# Patient Record
Sex: Female | Born: 1995 | Race: Black or African American | Hispanic: No | Marital: Single | Smoking: Never smoker
Health system: Southern US, Community
[De-identification: ages and names within clinical notes are randomized; demographics above are authoritative.]

---
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
## 2017-01-14 ENCOUNTER — Ambulatory Visit (HOSPITAL_COMMUNITY)
Admission: EM | Admit: 2017-01-14 | Discharge: 2017-01-14 | Disposition: A | Discharge status: Home / Self Care | Payer: No Typology Code available for payment source | Attending: Family Medicine | Admitting: Family Medicine

## 2017-01-14 ENCOUNTER — Encounter (HOSPITAL_COMMUNITY): Payer: Self-pay | Admitting: Family Medicine

## 2017-01-14 DIAGNOSIS — J209 Acute bronchitis, unspecified: Secondary | ICD-10-CM | POA: Diagnosis not present

## 2017-01-14 MED ORDER — AZITHROMYCIN 250 MG PO TABS: 250.0000 mg | ORAL_TABLET | Freq: Every day | ORAL | 0 refills | Status: AC

## 2017-01-14 MED ORDER — BENZONATATE 100 MG PO CAPS: 100.0000 mg | ORAL_CAPSULE | Freq: Three times a day (TID) | ORAL | 0 refills | Status: AC

## 2017-01-14 NOTE — ED Provider Notes (Signed)
CSN: [id number]     Arrival date & time 01/14/17  1941 History   First MD Initiated Contact with Patient 01/14/17 2035     Chief Complaint  Patient presents with  . Cough   (Consider location/radiation/quality/duration/timing/severity/associated sxs/prior Treatment) Patient c/o cough and uri sx's.   The history is provided by the patient.  Cough  Cough characteristics:  Productive Sputum characteristics:  White Severity:  Mild Onset quality:  Sudden Duration:  2 days Timing:  Constant Relieved by:  Nothing Worsened by:  Nothing   History reviewed. No pertinent past medical history. History reviewed. No pertinent surgical history. History reviewed. No pertinent family history. Social History  Substance Use Topics  . Smoking status: Never Smoker  . Smokeless tobacco: Never Used  . Alcohol use Not on file   OB History    No data available     Review of Systems  Constitutional: Negative.   HENT: Negative.   Eyes: Negative.   Respiratory: Positive for cough.   Cardiovascular: Negative.   Gastrointestinal: Negative.   Endocrine: Negative.   Genitourinary: Negative.   Musculoskeletal: Negative.   Neurological: Negative.   Hematological: Negative.   Psychiatric/Behavioral: Negative.     Allergies  Patient has no known allergies.  Home Medications   Prior to Admission medications   Medication Sig Start Date End Date Taking? Authorizing Provider  azithromycin (ZITHROMAX) 250 MG tablet Take 1 tablet (250 mg total) by mouth daily. Take first 2 tablets together, then 1 every day until finished. 01/14/17   Deatra CanterWilliam J Oxford, FNP  benzonatate (TESSALON) 100 MG capsule Take 1 capsule (100 mg total) by mouth every 8 (eight) hours. 01/14/17   Deatra CanterWilliam J Oxford, FNP  naproxen (NAPROSYN) 500 MG tablet Take 1 tablet (500 mg total) by mouth 2 (two) times daily. 08/27/16   Carlene CoriaSerena Y Sam, PA-C   Meds Ordered and Administered this Visit  Medications - No data to display  BP 132/73    Pulse 73   Temp 97.9 F (36.6 C)   Resp 18   SpO2 99%  No data found.   Physical Exam  Constitutional: She appears well-developed and well-nourished.  HENT:  Head: Normocephalic and atraumatic.  Right Ear: External ear normal.  Left Ear: External ear normal.  Mouth/Throat: Oropharynx is clear and moist.  Eyes: Conjunctivae and EOM are normal. Pupils are equal, round, and reactive to light.  Neck: Normal range of motion. Neck supple.  Cardiovascular: Normal rate, regular rhythm and normal heart sounds.   Pulmonary/Chest: Effort normal and breath sounds normal.  Nursing note and vitals reviewed.   Urgent Care Course     Procedures (including critical care time)  Labs Review Labs Reviewed - No data to display  Imaging Review No results found.   Visual Acuity Review  Right Eye Distance:   Left Eye Distance:   Bilateral Distance:    Right Eye Near:   Left Eye Near:    Bilateral Near:         MDM   1. Acute bronchitis, unspecified organism    zpak Tessalon perles  Push po fluids, rest, tylenol and motrin otc prn as directed for fever, arthralgias, and myalgias.  Follow up prn if sx's continue or persist.    Deatra CanterWilliam J Oxford, FNP 01/14/17 2056

## 2017-01-14 NOTE — ED Triage Notes (Signed)
Pt here for URI symptoms. sts was around mold.

## 2017-03-31 ENCOUNTER — Encounter (HOSPITAL_COMMUNITY): Payer: Self-pay

## 2017-03-31 ENCOUNTER — Emergency Department (HOSPITAL_COMMUNITY)
Admission: EM | Admit: 2017-03-31 | Discharge: 2017-03-31 | Disposition: A | Discharge status: Home / Self Care | Payer: No Typology Code available for payment source | Attending: Emergency Medicine | Admitting: Emergency Medicine

## 2017-03-31 DIAGNOSIS — J069 Acute upper respiratory infection, unspecified: Secondary | ICD-10-CM | POA: Insufficient documentation

## 2017-03-31 DIAGNOSIS — B9789 Other viral agents as the cause of diseases classified elsewhere: Secondary | ICD-10-CM

## 2017-03-31 NOTE — Discharge Instructions (Signed)
Please read instructions below. You can take advil or tylenol for sore throat and body aches. You can use saline nasal spray or mucinex for your congestion. Drink plenty of water. Return to the ER for inability to swallow liquids, difficulty breathing, new or worsening symptoms.

## 2017-03-31 NOTE — ED Provider Notes (Signed)
[facility]-EMERGENCY DEPT Provider Note   CSN: [id number] Arrival date & time: 03/31/17  2050  By signing my name below, I, Modena Jansky, attest that this documentation has been prepared under the direction and in the presence of non-physician practitioner, [place] Russo, PA-C. Electronically Signed: Modena Jansky, Scribe. 03/31/2017. 10:05 PM.  History   Chief Complaint Chief Complaint  Patient presents with  . URI   The history is provided by the patient. No language interpreter was used.   HPI Comments: Darlene Floyd is a 21 y.o. female who presents to the Emergency Department complaining of constant sore throat that started about 2 days ago. Her sudden onset pain has been gradually worsening and unrelieved by Claritin. She [facility] and told she had allergies. She reports associated intermittent sinus pressure, nasal congestion, productive cough, post-nasal drip, generalized myalgias, and headache. Denies any inability to swallow, difficulty breathing, SOB, or other complaints at this time.  History reviewed. No pertinent past medical history.  There are no active problems to display for this patient.   History reviewed. No pertinent surgical history.  OB History    No data available       Home Medications    Prior to Admission medications   Medication Sig Start Date End Date Taking? Authorizing Provider  azithromycin (ZITHROMAX) 250 MG tablet Take 1 tablet (250 mg total) by mouth daily. Take first 2 tablets together, then 1 every day until finished. 01/14/17   Deatra Canter, FNP  benzonatate (TESSALON) 100 MG capsule Take 1 capsule (100 mg total) by mouth every 8 (eight) hours. 01/14/17   Deatra Canter, FNP  naproxen (NAPROSYN) 500 MG tablet Take 1 tablet (500 mg total) by mouth 2 (two) times daily. 08/27/16   Carlene Coria, PA-C    Family History History reviewed. No pertinent family history.  Social History Social History    Substance Use Topics  . Smoking status: Never Smoker  . Smokeless tobacco: Never Used  . Alcohol use No     Allergies   Patient has no known allergies.   Review of Systems Review of Systems  Constitutional: Negative for chills and fever.  HENT: Positive for congestion (Nasal), postnasal drip, sinus pressure and sore throat. Negative for trouble swallowing and voice change.   Respiratory: Positive for cough (Productive). Negative for shortness of breath.   Cardiovascular: Negative for chest pain.  Musculoskeletal: Positive for myalgias (Generalized).  Neurological: Positive for headaches.     Physical Exam Updated Vital Signs Blood pressure 134/82, pulse 84, temperature 98.7 F (37.1 C), temperature source Oral, resp. rate 16, height 5' 4.5" (1.638 m), weight 104.3 kg, last menstrual period 03/09/2017, SpO2 100 %.   Physical Exam  Constitutional: She appears well-developed and well-nourished.  HENT:  Head: Normocephalic and atraumatic.  Right Ear: Tympanic membrane, external ear and ear canal normal.  Left Ear: Tympanic membrane, external ear and ear canal normal.  Mouth/Throat: Uvula is midline. No trismus in the jaw. No uvula swelling. Posterior oropharyngeal erythema (Mild) present. No oropharyngeal exudate. No tonsillar exudate.  Eyes: Conjunctivae are normal.  Neck: Normal range of motion. Neck supple.  Mild anterior cervical lymphadenopathy.   Cardiovascular: Normal rate, regular rhythm, normal heart sounds and intact distal pulses.  Exam reveals no friction rub.   No murmur heard. Pulmonary/Chest: Effort normal and breath sounds normal. No respiratory distress. She has no wheezes. She has no rales.  Lymphadenopathy:    She has  no cervical adenopathy.  Psychiatric: She has a normal mood and affect. Her behavior is normal.  Nursing note and vitals reviewed.    ED Treatments / Results  DIAGNOSTIC STUDIES: Oxygen Saturation is 98% on RA, normal by my  interpretation.    COORDINATION OF CARE: 10:09 PM- Pt advised of plan for treatment and pt agrees.  Labs (all labs ordered are listed, but only abnormal results are displayed) Labs Reviewed - No data to display  EKG  EKG Interpretation None       Radiology No results found.  Procedures Procedures (including critical care time)  Medications Ordered in ED Medications - No data to display   Initial Impression / Assessment and Plan / ED Course  I have reviewed the triage vital signs and the nursing notes.  Pertinent labs & imaging results that were available during my care of the patient were reviewed by me and considered in my medical decision making (see chart for details).      Patients symptoms are consistent with URI, likely viral etiology. Exam reassuring, no trismus, uvula midline, lungs CTAB, not in distress. Discussed that antibiotics are not indicated for viral infections. Pt will be discharged with symptomatic treatment. Pt is afebrile, hemodynamically stable & in NAD prior to dc.  Discussed results, findings, treatment and follow up. Patient advised of return precautions. Patient verbalized understanding and agreed with plan.   Final Clinical Impressions(s) / ED Diagnoses   Final diagnoses:  Viral upper respiratory tract infection with cough    New Prescriptions Discharge Medication List as of 03/31/2017 10:24 PM     I personally performed the services described in this documentation, which was scribed in my presence. The recorded information has been reviewed and is accurate.     [place] N Russo, PA-C 04/01/17 0255    Bethann Berkshire, MD 04/05/17 2146

## 2018-10-13 ENCOUNTER — Other Ambulatory Visit: Payer: Self-pay

## 2018-10-13 ENCOUNTER — Encounter (HOSPITAL_COMMUNITY): Payer: Self-pay | Admitting: Emergency Medicine

## 2018-10-13 ENCOUNTER — Emergency Department (HOSPITAL_COMMUNITY)
Admission: EM | Admit: 2018-10-13 | Discharge: 2018-10-13 | Disposition: A | Discharge status: Home / Self Care | Payer: BLUE CROSS/BLUE SHIELD | Attending: Emergency Medicine | Admitting: Emergency Medicine

## 2018-10-13 DIAGNOSIS — Z79899 Other long term (current) drug therapy: Secondary | ICD-10-CM | POA: Diagnosis not present

## 2018-10-13 DIAGNOSIS — W57XXXA Bitten or stung by nonvenomous insect and other nonvenomous arthropods, initial encounter: Secondary | ICD-10-CM | POA: Diagnosis not present

## 2018-10-13 DIAGNOSIS — S60862A Insect bite (nonvenomous) of left wrist, initial encounter: Secondary | ICD-10-CM | POA: Diagnosis present

## 2018-10-13 DIAGNOSIS — Y999 Unspecified external cause status: Secondary | ICD-10-CM | POA: Diagnosis not present

## 2018-10-13 DIAGNOSIS — Y939 Activity, unspecified: Secondary | ICD-10-CM | POA: Insufficient documentation

## 2018-10-13 DIAGNOSIS — Y929 Unspecified place or not applicable: Secondary | ICD-10-CM | POA: Insufficient documentation

## 2018-10-13 MED ORDER — CETIRIZINE HCL 10 MG PO TABS: 10.0000 mg | ORAL_TABLET | Freq: Every day | ORAL | 0 refills | Status: AC

## 2018-10-13 MED ORDER — TRIAMCINOLONE ACETONIDE 0.1 % EX CREA: 1.0000 "application " | TOPICAL_CREAM | Freq: Two times a day (BID) | CUTANEOUS | 0 refills | Status: AC

## 2018-10-13 NOTE — Discharge Instructions (Signed)
Take cetirizine daily until symptoms resolve. Use Kenalog cream as needed for itching and swelling. If area starts itch, apply a cool compress.  Do not scratch or irritate the area. Follow-up with your primary care doctor if symptoms are not improving. Return to the emergency room with any new, worsening, or concerning symptoms.

## 2018-10-13 NOTE — ED Provider Notes (Signed)
[facility] EMERGENCY DEPARTMENT Provider Note   CSN: [id number] Arrival date & time: 10/13/18  2230     History   Chief Complaint Chief Complaint  Patient presents with  . Insect Bite    HPI Darlene Floyd is a 22 y.o. female presenting for evaluation of insect bite on the wrist and left wrist swelling.  Patient states that last week, she got a bite on her left wrist.  It was initially small, with a pustular head.  Patient popped the pustule, but surrounding skin has continued to itch.  When she scratches at it, it becomes larger, red, and swollen.  She is not taking anything for this including antihistamines.  She has not tried anything including Benadryl or calamine lotion.  She denies lesions elsewhere.  She denies fevers, chills.  She denies numbness or tingling.  She denies trauma or injury.  She reports no pain, just itching.  HPI  History reviewed. No pertinent past medical history.  There are no active problems to display for this patient.   History reviewed. No pertinent surgical history.   OB History   None      Home Medications    Prior to Admission medications   Medication Sig Start Date End Date Taking? Authorizing Provider  azithromycin (ZITHROMAX) 250 MG tablet Take 1 tablet (250 mg total) by mouth daily. Take first 2 tablets together, then 1 every day until finished. 01/14/17   Deatra Canter, FNP  benzonatate (TESSALON) 100 MG capsule Take 1 capsule (100 mg total) by mouth every 8 (eight) hours. 01/14/17   Deatra Canter, FNP  cetirizine (ZYRTEC) 10 MG tablet Take 1 tablet (10 mg total) by mouth daily. 10/13/18   Josefa Syracuse, PA-C  naproxen (NAPROSYN) 500 MG tablet Take 1 tablet (500 mg total) by mouth 2 (two) times daily. 08/27/16   Sam, Ace Gins, PA-C  triamcinolone cream (KENALOG) 0.1 % Apply 1 application topically 2 (two) times daily. 10/13/18   Jamontae Thwaites, PA-C    Family History No family history on file.  Social  History Social History   Tobacco Use  . Smoking status: Never Smoker  . Smokeless tobacco: Never Used  Substance Use Topics  . Alcohol use: No  . Drug use: No     Allergies   Patient has no known allergies.   Review of Systems Review of Systems  Constitutional: Negative for fever.  Skin: Positive for color change.     Physical Exam Updated Vital Signs BP (!) 180/89 (BP Location: Right Arm)   Pulse 87   Temp 99.1 F (37.3 C) (Oral)   Resp 16   Ht 5\' 4"  (1.626 m)   Wt 104.3 kg   LMP 09/22/2018   SpO2 99%   BMI 39.48 kg/m   Physical Exam  Constitutional: She is oriented to person, place, and time. She appears well-developed and well-nourished. No distress.  HENT:  Head: Normocephalic and atraumatic.  Eyes: EOM are normal.  Neck: Normal range of motion.  Pulmonary/Chest: Effort normal.  Abdominal: She exhibits no distension.  Musculoskeletal: Normal range of motion.  Neurological: She is alert and oriented to person, place, and time.  Skin: Skin is warm. Capillary refill takes less than 2 seconds. No rash noted. There is erythema.  Area of mild swelling of the left wrist overlying the radial aspect.  Central lesion consistent with a excoriated bug bite.  Area is mildly erythematous and warm.  No fluctuance or induration.  No drainage.  No tenderness.  Psychiatric: She has a normal mood and affect.  Nursing note and vitals reviewed.    ED Treatments / Results  Labs (all labs ordered are listed, but only abnormal results are displayed) Labs Reviewed - No data to display  EKG None  Radiology No results found.  Procedures Procedures (including critical care time)  Medications Ordered in ED Medications - No data to display   Initial Impression / Assessment and Plan / ED Course  I have reviewed the triage vital signs and the nursing notes.  Pertinent labs & imaging results that were available during my care of the patient were reviewed by me and  considered in my medical decision making (see chart for details).     Pt presenting for evaluation of swelling, itching, and singular lesion on her left wrist.  Physical exam reassuring, she is afebrile not tachycardic.  Appears nontoxic.  She is neurovascularly intact.  No tenderness of the area.  I favor inflammatory etiology over infectious, as she no pain, induration, fluctuance.  Patient states that the more she scratches it, the more it appears to swell and spread.  This is likely due to a histamine response.  Discussed this with patient.  Discussed treatment with antihistamine and Kenalog cream.  She is encouraged to stop touching or irritating the area.  Patient to follow-up as needed.  At this time, patient appears safe for discharge.  Return precautions given.  Patient states she understands and agrees to plan.   Final Clinical Impressions(s) / ED Diagnoses   Final diagnoses:  Insect bite of left wrist, initial encounter    ED Discharge Orders         Ordered    cetirizine (ZYRTEC) 10 MG tablet  Daily     10/13/18 2346    triamcinolone cream (KENALOG) 0.1 %  2 times daily     10/13/18 2346           Mady Oubre, PA-C 10/13/18 2347    Virgina Norfolk, DO 10/14/18 0200

## 2018-10-13 NOTE — ED Triage Notes (Signed)
C/o ? Insect bite to L wrist 1 week ago.  Reports itching and swelling to L wrist.  Has not taken any medication for itching.

## 2018-10-13 NOTE — ED Notes (Signed)
No answer for triage.  Pt in restroom.

## 2019-11-10 ENCOUNTER — Other Ambulatory Visit: Payer: Self-pay

## 2019-11-10 ENCOUNTER — Emergency Department (HOSPITAL_COMMUNITY): Payer: Self-pay

## 2019-11-10 ENCOUNTER — Emergency Department (HOSPITAL_COMMUNITY)
Admission: EM | Admit: 2019-11-10 | Discharge: 2019-11-10 | Disposition: A | Discharge status: Home / Self Care | Payer: Self-pay | Attending: Emergency Medicine | Admitting: Emergency Medicine

## 2019-11-10 ENCOUNTER — Encounter (HOSPITAL_COMMUNITY): Payer: Self-pay | Admitting: *Deleted

## 2019-11-10 DIAGNOSIS — R5383 Other fatigue: Secondary | ICD-10-CM | POA: Insufficient documentation

## 2019-11-10 DIAGNOSIS — R519 Headache, unspecified: Secondary | ICD-10-CM | POA: Insufficient documentation

## 2019-11-10 DIAGNOSIS — U071 COVID-19: Secondary | ICD-10-CM | POA: Insufficient documentation

## 2019-11-10 DIAGNOSIS — R438 Other disturbances of smell and taste: Secondary | ICD-10-CM | POA: Insufficient documentation

## 2019-11-10 DIAGNOSIS — Z79899 Other long term (current) drug therapy: Secondary | ICD-10-CM | POA: Insufficient documentation

## 2019-11-10 DIAGNOSIS — Z20822 Contact with and (suspected) exposure to covid-19: Secondary | ICD-10-CM

## 2019-11-10 LAB — GROUP A STREP BY PCR: Group A Strep by PCR: NOT DETECTED

## 2019-11-10 NOTE — Discharge Instructions (Signed)
Covid test will probably take a few days to result as they are back logged right now.  Continue to quarantine at home, manage symptoms best you can with tylenol/motrin, good oral hydration.  See full CDC guidelines below for home quarantine protocol. Follow-up with your primary care doctor. Return here for any new/acute changes.     Person Under Monitoring Name: Darlene Floyd  Location: [place] [address]   Infection Prevention Recommendations for Individuals Confirmed to have, or Being Evaluated for, 2019 Novel Coronavirus (COVID-19) Infection Who Receive Care at Home  Individuals who are confirmed to have, or are being evaluated for, COVID-19 should follow the prevention steps below until a healthcare provider or local or state health department says they can return to normal activities.  Stay home except to get medical care You should restrict activities outside your home, except for getting medical care. Do not go to work, school, or public areas, and do not use public transportation or taxis.  Call ahead before visiting your doctor Before your medical appointment, call the healthcare provider and tell them that you have, or are being evaluated for, COVID-19 infection. This will help the healthcare providers office take steps to keep other people from getting infected. Ask your healthcare provider to call the local or state health department.  Monitor your symptoms Seek prompt medical attention if your illness is worsening (e.g., difficulty breathing). Before going to your medical appointment, call the healthcare provider and tell them that you have, or are being evaluated for, COVID-19 infection. Ask your healthcare provider to call the local or state health department.  Wear a facemask You should wear a facemask that covers your nose and mouth when you are in the same room with other people and when you visit a healthcare provider. People who live with  or visit you should also wear a facemask while they are in the same room with you.  Separate yourself from other people in your home As much as possible, you should stay in a different room from other people in your home. Also, you should use a separate bathroom, if available.  Avoid sharing household items You should not share dishes, drinking glasses, cups, eating utensils, towels, bedding, or other items with other people in your home. After using these items, you should wash them thoroughly with soap and water.  Cover your coughs and sneezes Cover your mouth and nose with a tissue when you cough or sneeze, or you can cough or sneeze into your sleeve. Throw used tissues in a lined trash can, and immediately wash your hands with soap and water for at least 20 seconds or use an alcohol-based hand rub.  Wash your [facility] your hands often and thoroughly with soap and water for at least 20 seconds. You can use an alcohol-based hand sanitizer if soap and water are not available and if your hands are not visibly dirty. Avoid touching your eyes, nose, and mouth with unwashed hands.   Prevention Steps for Caregivers and Household Members of Individuals Confirmed to have, or Being Evaluated for, COVID-19 Infection Being Cared for in the Home  If you live with, or provide care at home for, a person confirmed to have, or being evaluated for, COVID-19 infection please follow these guidelines to prevent infection:  Follow healthcare providers instructions Make sure that you understand and can help the patient follow any healthcare provider instructions for all care.  Provide for the patients basic needs You should help  the patient with basic needs in the home and provide support for getting groceries, prescriptions, and other personal needs.  Monitor the patients symptoms If they are getting sicker, call his or her medical provider and tell them that the patient has, or is being  evaluated for, COVID-19 infection. This will help the healthcare providers office take steps to keep other people from getting infected. Ask the healthcare provider to call the local or state health department.  Limit the number of people who have contact with the patient If possible, have only one caregiver for the patient. Other household members should stay in another home or place of residence. If this is not possible, they should stay in another room, or be separated from the patient as much as possible. Use a separate bathroom, if available. Restrict visitors who do not have an essential need to be in the home.  Keep older adults, very young children, and other sick people away from the patient Keep older adults, very young children, and those who have compromised immune systems or chronic health conditions away from the patient. This includes people with chronic heart, lung, or kidney conditions, diabetes, and cancer.  Ensure good ventilation Make sure that shared spaces in the home have good air flow, such as from an air conditioner or an opened window, weather permitting.  Wash your hands often Wash your hands often and thoroughly with soap and water for at least 20 seconds. You can use an alcohol based hand sanitizer if soap and water are not available and if your hands are not visibly dirty. Avoid touching your eyes, nose, and mouth with unwashed hands. Use disposable paper towels to dry your hands. If not available, use dedicated cloth towels and replace them when they become wet.  Wear a facemask and gloves Wear a disposable facemask at all times in the room and gloves when you touch or have contact with the patients blood, body fluids, and/or secretions or excretions, such as sweat, saliva, sputum, nasal mucus, vomit, urine, or feces.  Ensure the mask fits over your nose and mouth tightly, and do not touch it during use. Throw out disposable facemasks and gloves after using  them. Do not reuse. Wash your hands immediately after removing your facemask and gloves. If your personal clothing becomes contaminated, carefully remove clothing and launder. Wash your hands after handling contaminated clothing. Place all used disposable facemasks, gloves, and other waste in a lined container before disposing them with other household waste. Remove gloves and wash your hands immediately after handling these items.  Do not share dishes, glasses, or other household items with the patient Avoid sharing household items. You should not share dishes, drinking glasses, cups, eating utensils, towels, bedding, or other items with a patient who is confirmed to have, or being evaluated for, COVID-19 infection. After the person uses these items, you should wash them thoroughly with soap and water.  Wash laundry thoroughly Immediately remove and wash clothes or bedding that have blood, body fluids, and/or secretions or excretions, such as sweat, saliva, sputum, nasal mucus, vomit, urine, or feces, on them. Wear gloves when handling laundry from the patient. Read and follow directions on labels of laundry or clothing items and detergent. In general, wash and dry with the warmest temperatures recommended on the label.  Clean all areas the individual has used often Clean all touchable surfaces, such as counters, tabletops, doorknobs, bathroom fixtures, toilets, phones, keyboards, tablets, and bedside tables, every day. Also, clean any surfaces  that may have blood, body fluids, and/or secretions or excretions on them. Wear gloves when cleaning surfaces the patient has come in contact with. Use a diluted bleach solution (e.g., dilute bleach with 1 part bleach and 10 parts water) or a household disinfectant with a label that says EPA-registered for coronaviruses. To make a bleach solution at home, add 1 tablespoon of bleach to 1 quart (4 cups) of water. For a larger supply, add  cup of bleach to 1  gallon (16 cups) of water. Read labels of cleaning products and follow recommendations provided on product labels. Labels contain instructions for safe and effective use of the cleaning product including precautions you should take when applying the product, such as wearing gloves or eye protection and making sure you have good ventilation during use of the product. Remove gloves and wash hands immediately after cleaning.  Monitor yourself for signs and symptoms of illness Caregivers and household members are considered close contacts, should monitor their health, and will be asked to limit movement outside of the home to the extent possible. Follow the monitoring steps for close contacts listed on the symptom monitoring form.   ? If you have additional questions, contact your local health department or call the epidemiologist on call at [phone] (available 24/7). ? This guidance is subject to change. For the most up-to-date guidance from [facility], please refer to their website: [email or web address]

## 2019-11-10 NOTE — ED Provider Notes (Signed)
[facility] EMERGENCY DEPARTMENT Provider Note   CSN: [id number] Arrival date & time: 11/10/19  2045     History   Chief Complaint Chief Complaint  Patient presents with  . Sore Throat    HPI Darlene Floyd is a 23 y.o. female.     The history is provided by the patient and medical records.  Sore Throat     23 y.o. female with no significant past medical history presenting to the ED with sore throat, headache, loss of taste and smell, and fatigue over the past several days.  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  She had a negative rapid Covid test on Wednesday and was supposed to get her send out test back today, however the school has closed for the rest of the year and she was told she may not get her results.  She has not had any significant cough.  Headache today did progress and get a little worse but feeling better after Tylenol prior to arrival.  No fevers, neck pain, or neck stiffness.  History reviewed. No pertinent past medical history.  There are no active problems to display for this patient.   History reviewed. No pertinent surgical history.   OB History   No obstetric history on file.      Home Medications    Prior to Admission medications   Medication Sig Start Date End Date Taking? Authorizing Provider  azithromycin (ZITHROMAX) 250 MG tablet Take 1 tablet (250 mg total) by mouth daily. Take first 2 tablets together, then 1 every day until finished. 01/14/17   Lysbeth Penner, FNP  benzonatate (TESSALON) 100 MG capsule Take 1 capsule (100 mg total) by mouth every 8 (eight) hours. 01/14/17   Lysbeth Penner, FNP  cetirizine (ZYRTEC) 10 MG tablet Take 1 tablet (10 mg total) by mouth daily. 10/13/18   Caccavale, Sophia, PA-C  naproxen (NAPROSYN) 500 MG tablet Take 1 tablet (500 mg total) by mouth 2 (two) times daily. 08/27/16   Sam, Olivia Canter, PA-C  triamcinolone cream (KENALOG) 0.1 %  Apply 1 application topically 2 (two) times daily. 10/13/18   Caccavale, Sophia, PA-C    Family History No family history on file.  Social History Social History   Tobacco Use  . Smoking status: Never Smoker  . Smokeless tobacco: Never Used  Substance Use Topics  . Alcohol use: No  . Drug use: No     Allergies   Patient has no known allergies.   Review of Systems Review of Systems  Constitutional: Positive for fatigue.  HENT:       Loss of taste and smell  All other systems reviewed and are negative.    Physical Exam Updated Vital Signs BP (!) 152/107 (BP Location: Right Arm)   Pulse 65   Temp 98.6 F (37 C) (Oral)   Resp 16   Ht 5\' 3"  (1.6 m)   Wt 102.5 kg   LMP 10/31/2019   SpO2 96%   BMI 40.03 kg/m   Physical Exam Vitals signs and nursing note reviewed.  Constitutional:      General: She is not in acute distress.    Appearance: She is well-developed. She is not diaphoretic.     Comments: Listening to music, NAD  HENT:     Head: Normocephalic and atraumatic.     Right Ear: External ear normal.     Left Ear: External ear normal.  Eyes:     Conjunctiva/sclera: Conjunctivae normal.     Pupils: Pupils are equal, round, and reactive to light.  Neck:     Musculoskeletal: Full passive range of motion without pain, normal range of motion and neck supple. No neck rigidity.     Comments: No rigidity, no meningismus Cardiovascular:     Rate and Rhythm: Normal rate and regular rhythm.     Heart sounds: Normal heart sounds. No murmur.  Pulmonary:     Effort: Pulmonary effort is normal. No respiratory distress.     Breath sounds: Normal breath sounds. No wheezing or rhonchi.  Abdominal:     General: Bowel sounds are normal.     Palpations: Abdomen is soft.     Tenderness: There is no abdominal tenderness. There is no guarding.  Musculoskeletal: Normal range of motion.  Skin:    General: Skin is warm and dry.     Findings: No rash.  Neurological:      Mental Status: She is alert and oriented to person, place, and time.     Cranial Nerves: No cranial nerve deficit.     Sensory: No sensory deficit.     Motor: No tremor or seizure activity.     Comments: AAOx3, answering questions and following commands appropriately; equal strength UE and LE bilaterally; CN grossly intact; moves all extremities appropriately without ataxia; no focal neuro deficits or facial asymmetry appreciated  Psychiatric:        Behavior: Behavior normal.        Thought Content: Thought content normal.      ED Treatments / Results  Labs (all labs ordered are listed, but only abnormal results are displayed) Labs Reviewed  GROUP A STREP BY PCR  SARS CORONAVIRUS 2 (TAT 6-24 HRS)    EKG None  Radiology Dg Chest Portable 1 View  Result Date: 11/10/2019 CLINICAL DATA:  Body aches, chills EXAM: PORTABLE CHEST 1 VIEW COMPARISON:  None. FINDINGS: Heart and mediastinal contours are within normal limits. No focal opacities or effusions. No acute bony abnormality. IMPRESSION: No active disease. Electronically Signed   By: Charlett Nose M.D.   On: 11/10/2019 21:39    Procedures Procedures (including critical care time)  Medications Ordered in ED Medications - No data to display   Initial Impression / Assessment and Plan / ED Course  I have reviewed the triage vital signs and the nursing notes.  Pertinent labs & imaging results that were available during my care of the patient were reviewed by me and considered in my medical decision making (see chart for details).  23 year old female here with concerns of Covid.  Since Wednesday she has had loss of taste and smell, headache, fatigue, and overall feeling poorly.  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  She had a rapid test on Wednesday that was negative, however was told that her send out test was to come back today.  She was notified that the school has decided to close for the  remainder of the year and she may not get her results.  States she came in today because headache was getting worse but has since improved with Tylenol.  She is afebrile nontoxic in appearance.  Neurologically intact without any noted deficit.  She does not have any clinical signs or symptoms suggestive of meningitis.  Rapid strep and portable chest x-ray here are negative.  Will send repeat Covid test, advised it may take several days for results  as they are currently backlog.  She will need to continue to self quarantine, encouraged symptomatic care and good oral hydration.  Close follow-up with PCP.  Return here for any new/acute changes.  Darlene Floyd was evaluated in Emergency Department on 11/10/2019 for the symptoms described in the history of present illness. She was evaluated in the context of the global COVID-19 pandemic, which necessitated consideration that the patient might be at risk for infection with the SARS-CoV-2 virus that causes COVID-19. Institutional protocols and algorithms that pertain to the evaluation of patients at risk for COVID-19 are in a state of rapid change based on information released by regulatory bodies including the CDC and federal and state organizations. These policies and algorithms were followed during the patient's care in the ED.   Final Clinical Impressions(s) / ED Diagnoses   Final diagnoses:  Suspected COVID-19 virus infection    ED Discharge Orders    None       Garlon HatchetSanders, Rosana Farnell M, PA-C 11/10/19 2302    Tilden Fossaees, Elizabeth, MD 11/11/19 [phone]

## 2019-11-10 NOTE — ED Triage Notes (Signed)
The pt is here because she thinks she has covid  Because she was exposed on wednewsday to a fdriend with covid  She tested negative Wednesday for covid  Generalized body aches sorethroat cough  No temp  Loss of smell and taste

## 2019-11-11 LAB — SARS CORONAVIRUS 2 (TAT 6-24 HRS): SARS Coronavirus 2: POSITIVE — AB

## 2019-11-12 ENCOUNTER — Ambulatory Visit: Payer: Self-pay

## 2019-11-12 NOTE — Telephone Encounter (Addendum)
Provided Covid -19 results.  Provided care advice. Voiced understanding.  Reviewed isolation protocol with Patient.  Voiced understanding. Recommended Patient take cough medication of her choice. Voiced understanding.

## 2020-12-02 ENCOUNTER — Emergency Department (HOSPITAL_COMMUNITY)
Admission: EM | Admit: 2020-12-02 | Discharge: 2020-12-03 | Disposition: A | Discharge status: Home / Self Care | Payer: Medicaid Other | Attending: Emergency Medicine | Admitting: Emergency Medicine

## 2020-12-02 ENCOUNTER — Other Ambulatory Visit: Payer: Self-pay

## 2020-12-02 DIAGNOSIS — J029 Acute pharyngitis, unspecified: Secondary | ICD-10-CM | POA: Insufficient documentation

## 2020-12-02 DIAGNOSIS — Z5321 Procedure and treatment not carried out due to patient leaving prior to being seen by health care provider: Secondary | ICD-10-CM | POA: Insufficient documentation

## 2020-12-02 DIAGNOSIS — R059 Cough, unspecified: Secondary | ICD-10-CM | POA: Insufficient documentation

## 2020-12-02 NOTE — ED Triage Notes (Signed)
Pt here requesting Covid test to rule out, states she has a sore throat, nonproductive cough & discomfort with coughing. Pt denies known covid contact & vaccination.

## 2020-12-03 NOTE — ED Notes (Signed)
No answer for VS x3 

## 2021-02-28 IMAGING — DX DG CHEST 1V PORT
1 series · 1 of 1 positions shown · non-contrast
Comparison: None.

CLINICAL DATA: Body aches, chills

EXAM:
PORTABLE CHEST 1 VIEW

[chest ap]
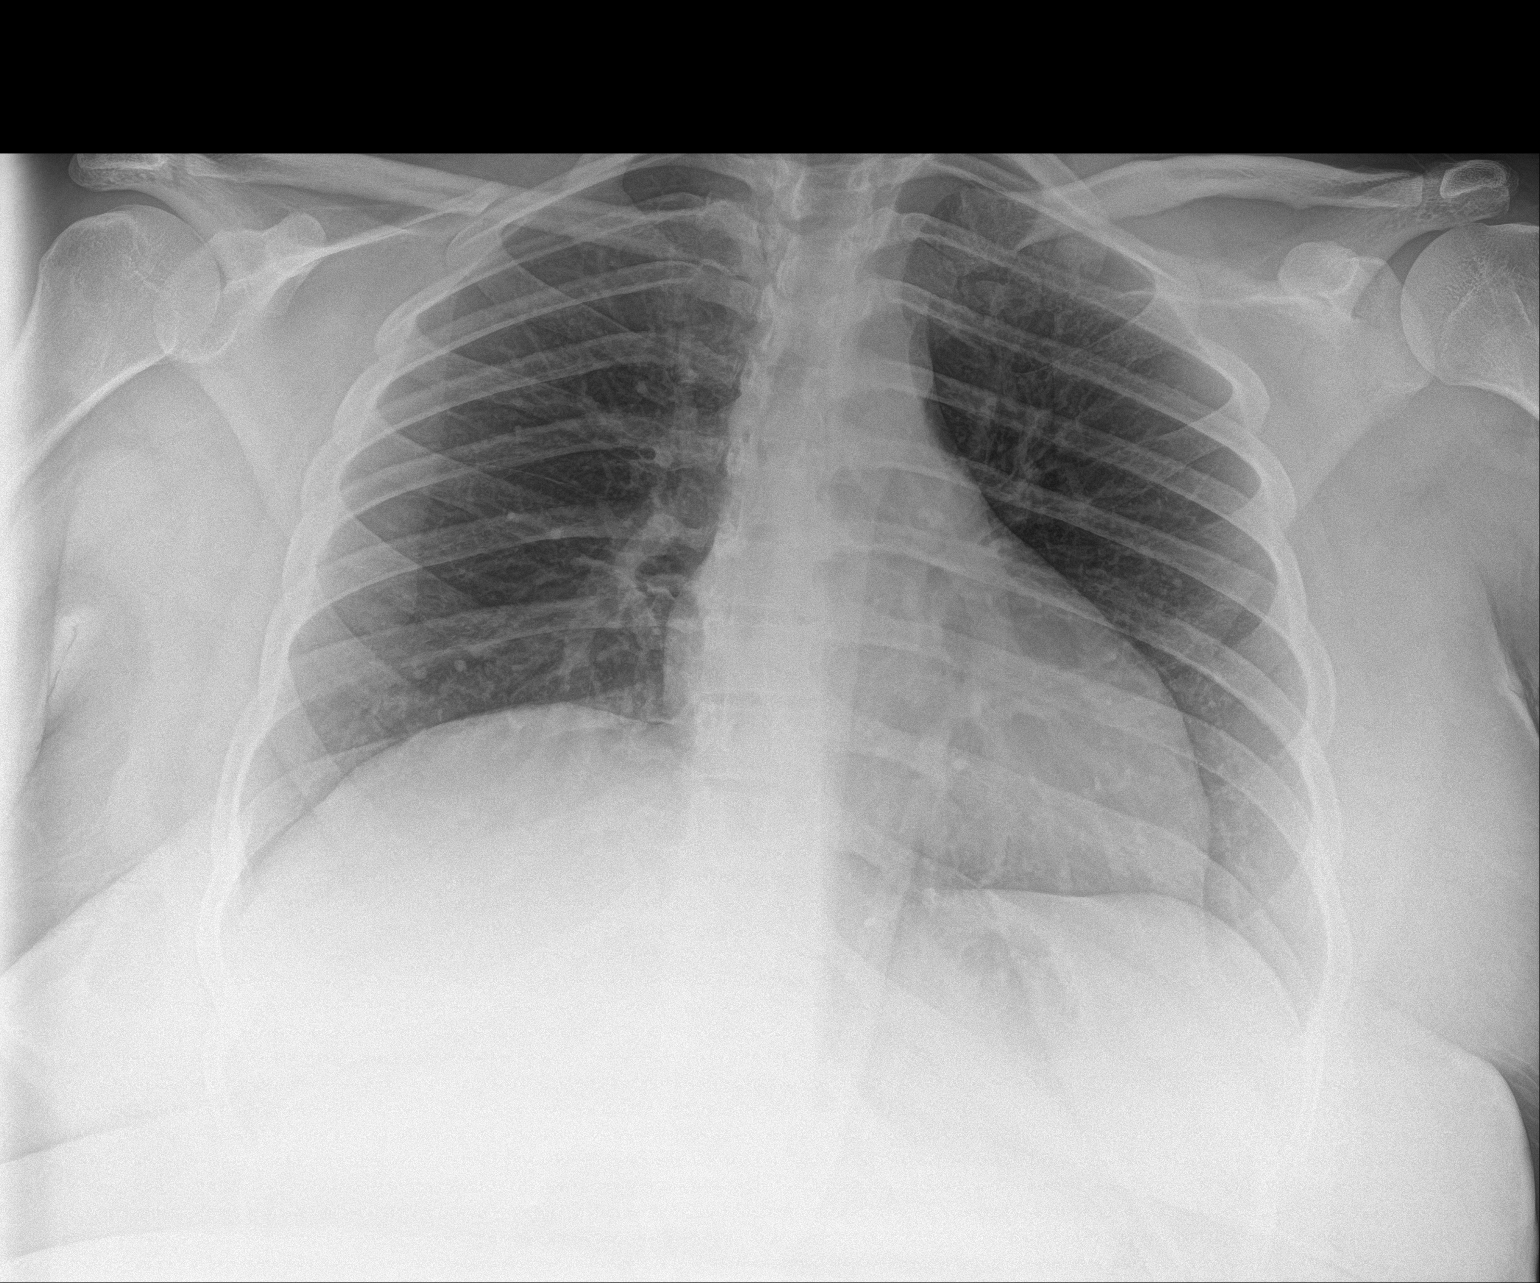

[1 of 1 positions shown; findings below may reference images not displayed]

FINDINGS: Heart and mediastinal contours are within normal limits. No focal
opacities or effusions. No acute bony abnormality.
IMPRESSION: No active disease.
